# Patient Record
Sex: Female | Born: 1935 | Race: White | Hispanic: No
Health system: Southern US, Community
[De-identification: ages and names within clinical notes are randomized; demographics above are authoritative.]

---

## 1997-08-11 ENCOUNTER — Other Ambulatory Visit: Admission: RE | Admit: 1997-08-11 | Discharge: 1997-08-11 | Payer: Self-pay | Admitting: Internal Medicine

## 1997-08-12 ENCOUNTER — Other Ambulatory Visit: Admission: RE | Admit: 1997-08-12 | Discharge: 1997-08-12 | Payer: Self-pay | Admitting: Internal Medicine

## 1997-09-20 ENCOUNTER — Other Ambulatory Visit: Admission: RE | Admit: 1997-09-20 | Discharge: 1997-09-20 | Payer: Self-pay | Admitting: Internal Medicine

## 1998-08-02 ENCOUNTER — Other Ambulatory Visit: Admission: RE | Admit: 1998-08-02 | Discharge: 1998-08-02 | Payer: Self-pay | Admitting: Internal Medicine

## 1999-02-01 ENCOUNTER — Ambulatory Visit (HOSPITAL_COMMUNITY): Admission: RE | Admit: 1999-02-01 | Discharge: 1999-02-01 | Payer: Self-pay | Admitting: Specialist

## 1999-03-29 ENCOUNTER — Ambulatory Visit (HOSPITAL_COMMUNITY): Admission: RE | Admit: 1999-03-29 | Discharge: 1999-03-29 | Payer: Self-pay | Admitting: Specialist

## 1999-06-02 ENCOUNTER — Encounter: Admission: RE | Admit: 1999-06-02 | Discharge: 1999-06-02 | Payer: Self-pay | Admitting: Internal Medicine

## 1999-06-02 ENCOUNTER — Encounter: Payer: Self-pay | Admitting: Internal Medicine

## 1999-06-22 ENCOUNTER — Other Ambulatory Visit: Admission: RE | Admit: 1999-06-22 | Discharge: 1999-06-22 | Payer: Self-pay | Admitting: Internal Medicine

## 1999-06-23 ENCOUNTER — Ambulatory Visit (HOSPITAL_COMMUNITY): Admission: RE | Admit: 1999-06-23 | Discharge: 1999-06-23 | Payer: Self-pay | Admitting: Internal Medicine

## 1999-09-28 ENCOUNTER — Encounter: Admission: RE | Admit: 1999-09-28 | Discharge: 1999-09-28 | Payer: Self-pay | Admitting: Internal Medicine

## 1999-09-28 ENCOUNTER — Encounter: Payer: Self-pay | Admitting: Internal Medicine

## 1999-09-29 ENCOUNTER — Encounter: Payer: Self-pay | Admitting: Internal Medicine

## 1999-09-29 ENCOUNTER — Encounter: Admission: RE | Admit: 1999-09-29 | Discharge: 1999-09-29 | Payer: Self-pay | Admitting: Internal Medicine

## 1999-09-30 ENCOUNTER — Encounter: Admission: RE | Admit: 1999-09-30 | Discharge: 1999-09-30 | Payer: Self-pay | Admitting: Internal Medicine

## 1999-09-30 ENCOUNTER — Encounter: Payer: Self-pay | Admitting: Internal Medicine

## 2000-06-06 ENCOUNTER — Ambulatory Visit (HOSPITAL_COMMUNITY): Admission: RE | Admit: 2000-06-06 | Discharge: 2000-06-07 | Payer: Self-pay | Admitting: Internal Medicine

## 2000-06-07 ENCOUNTER — Encounter: Payer: Self-pay | Admitting: Internal Medicine

## 2001-12-15 ENCOUNTER — Encounter: Payer: Self-pay | Admitting: Emergency Medicine

## 2001-12-16 ENCOUNTER — Inpatient Hospital Stay (HOSPITAL_COMMUNITY): Admission: EM | Admit: 2001-12-16 | Discharge: 2001-12-17 | Payer: Self-pay | Admitting: Emergency Medicine

## 2001-12-17 ENCOUNTER — Encounter: Payer: Self-pay | Admitting: Internal Medicine

## 2002-02-03 ENCOUNTER — Ambulatory Visit (HOSPITAL_COMMUNITY): Admission: RE | Admit: 2002-02-03 | Discharge: 2002-02-03 | Payer: Self-pay | Admitting: Internal Medicine

## 2002-02-03 ENCOUNTER — Encounter: Payer: Self-pay | Admitting: Internal Medicine

## 2002-05-26 ENCOUNTER — Ambulatory Visit (HOSPITAL_COMMUNITY): Admission: RE | Admit: 2002-05-26 | Discharge: 2002-05-26 | Payer: Self-pay | Admitting: Internal Medicine

## 2002-06-24 ENCOUNTER — Encounter: Payer: Self-pay | Admitting: Internal Medicine

## 2002-06-24 ENCOUNTER — Ambulatory Visit (HOSPITAL_COMMUNITY): Admission: RE | Admit: 2002-06-24 | Discharge: 2002-06-24 | Payer: Self-pay | Admitting: Internal Medicine

## 2002-07-01 ENCOUNTER — Encounter (HOSPITAL_COMMUNITY): Admission: RE | Admit: 2002-07-01 | Discharge: 2002-07-31 | Payer: Self-pay | Admitting: Internal Medicine

## 2002-07-02 ENCOUNTER — Encounter: Payer: Self-pay | Admitting: Internal Medicine

## 2002-07-13 ENCOUNTER — Emergency Department (HOSPITAL_COMMUNITY): Admission: EM | Admit: 2002-07-13 | Discharge: 2002-07-13 | Payer: Self-pay | Admitting: Emergency Medicine

## 2002-07-13 ENCOUNTER — Encounter: Payer: Self-pay | Admitting: Emergency Medicine

## 2003-01-07 ENCOUNTER — Emergency Department (HOSPITAL_COMMUNITY): Admission: EM | Admit: 2003-01-07 | Discharge: 2003-01-07 | Payer: Self-pay | Admitting: Emergency Medicine

## 2003-03-15 ENCOUNTER — Ambulatory Visit (HOSPITAL_COMMUNITY): Admission: RE | Admit: 2003-03-15 | Discharge: 2003-03-15 | Payer: Self-pay | Admitting: Internal Medicine

## 2003-09-01 ENCOUNTER — Ambulatory Visit (HOSPITAL_COMMUNITY): Admission: RE | Admit: 2003-09-01 | Discharge: 2003-09-01 | Payer: Self-pay | Admitting: Cardiology

## 2004-04-12 ENCOUNTER — Ambulatory Visit: Payer: Self-pay | Admitting: Internal Medicine

## 2004-10-05 ENCOUNTER — Ambulatory Visit: Payer: Self-pay | Admitting: Internal Medicine

## 2005-04-03 ENCOUNTER — Ambulatory Visit: Payer: Self-pay | Admitting: Internal Medicine

## 2007-03-07 ENCOUNTER — Encounter: Payer: Self-pay | Admitting: Internal Medicine

## 2008-10-08 ENCOUNTER — Emergency Department (HOSPITAL_COMMUNITY): Admission: EM | Admit: 2008-10-08 | Discharge: 2008-10-08 | Payer: Self-pay | Admitting: Emergency Medicine

## 2009-08-04 ENCOUNTER — Other Ambulatory Visit: Admission: RE | Admit: 2009-08-04 | Discharge: 2009-08-04 | Payer: Self-pay | Admitting: Obstetrics and Gynecology

## 2009-08-12 ENCOUNTER — Ambulatory Visit (HOSPITAL_COMMUNITY): Admission: RE | Admit: 2009-08-12 | Discharge: 2009-08-12 | Payer: Self-pay | Admitting: Internal Medicine

## 2009-11-03 ENCOUNTER — Encounter: Payer: Self-pay | Admitting: Internal Medicine

## 2009-12-02 ENCOUNTER — Encounter: Admission: RE | Admit: 2009-12-02 | Discharge: 2009-12-02 | Payer: Self-pay | Admitting: Cardiology

## 2009-12-02 ENCOUNTER — Encounter: Payer: Self-pay | Admitting: Internal Medicine

## 2009-12-12 ENCOUNTER — Observation Stay (HOSPITAL_COMMUNITY): Admission: RE | Admit: 2009-12-12 | Discharge: 2009-12-13 | Payer: Self-pay | Admitting: Cardiovascular Disease

## 2010-01-27 ENCOUNTER — Encounter: Payer: Self-pay | Admitting: Internal Medicine

## 2010-03-21 ENCOUNTER — Ambulatory Visit
Admission: RE | Admit: 2010-03-21 | Discharge: 2010-03-21 | Payer: Self-pay | Source: Home / Self Care | Attending: Vascular Surgery | Admitting: Vascular Surgery

## 2010-03-22 NOTE — H&P (Signed)
HISTORY AND PHYSICAL EXAMINATION  March 21, 2010  Re:  Jaime [name], Jaime [name]                 DOB:  Sep 30, 1919  This patient presents today for evaluation of critical ischemia to her left foot.  She is here with multiple family members who care for her. She is a very pleasant, mildly demented, 77 year old white female with a long history of peripheral vascular occlusive disease.  She underwent recent evaluation at [facility] in October with arteriography by Dr. Nanetta Batty.  This reveals severe peripheral vascular occlusive disease with superficial femoral, popliteal and severe tibial disease with no tibial run off bilaterally.  She has had ulcerations over her foot and has had progressive pain and this is now severe and intolerable to her.  This is worse at night, and keeps her awake at night.  PAST HISTORY:  Notable for hypertension, elevated cholesterol.  She does have history of cardiac valvular disease.  PAST SURGICAL HISTORY:  Hemorrhoidectomy 15 years ago, knee replacement 14 years ago,  cataract surgery 8 years ago and D and C 30 years ago.  SOCIAL HISTORY:  She is widowed with two children.  She is retired.  She does not smoke or drink alcohol.  FAMILY HISTORY:  Significant for premature atherosclerotic disease in her father, otherwise negative.  REVIEW OF SYSTEMS:  GENERAL: No weight loss or gains, weight 120 pounds. She is 4 feet 11 inches tall. VASCULAR:  Positive for pain with nonhealing ulcer. CARDIAC:  For heart murmur. GI:  Positive for constipation. NEUROLOGIC: Negative. PULMONARY:  Positive for productive cough. GENITOURINARY:  Positive for urinary frequency. HEMATOLOGY:  Negative. ENT:  Negative. MUSCULOSKELETAL:  Positive for arthritis, joint pain.  PHYSICAL EXAMINATION:  A well-developed, well-nourished, frail white female appearing stated age in no acute distress.  Blood pressure is 127/72, pulse 95, respirations 18,  temperature is 97.6.  HEENT:  Normal. Chest:  Clear bilaterally.  Heart:  Regular rate and rhythm.  I do not appreciate a murmur.  Abdomen:  Soft, nontender.  No masses noted. Musculoskeletal:  Shows no major deformities.  Neurologic: No focal weakness or paresthesias.  Skin:  Without ulcers or rashes aside from her left foot where she has a superficial ulcerations over the toes, dorsum of the foot and heel.  I have very long discussion with multiple family members present and the patient.  I agree with Dr. Fredirick Maudlin assessment that there is no option for limb salvage.  I have recommended left above-knee amputation for control of her tissue loss and most importantly for pain control.  She understands this and wishes to proceed with surgery.  We have scheduled this for this coming Friday at [facility] on 03/09/2010 with admission day surgery.    Larina Earthly, M.D. Electronically Signed  TFE/MEDQ  D:  03/21/2010  T:  03/22/2010  Job:  [id number]  cc:   Thereasa Solo. Little, M.D. Kingsley Callander. Ouida Sills, MD

## 2010-03-23 LAB — PROTIME-INR
INR: 0.95 (ref 0.00–1.49)
Prothrombin Time: 12.9 seconds (ref 11.6–15.2)

## 2010-03-23 LAB — BASIC METABOLIC PANEL
BUN: 20 mg/dL (ref 6–23)
CO2: 23 mEq/L (ref 19–32)
Calcium: 9.5 mg/dL (ref 8.4–10.5)
Chloride: 94 mEq/L — ABNORMAL LOW (ref 96–112)
Creatinine, Ser: 0.91 mg/dL (ref 0.4–1.2)
GFR calc Af Amer: >60 mL/min (ref >60)
GFR calc non Af Amer: 58 mL/min — ABNORMAL LOW (ref >60)
Glucose, Bld: 125 mg/dL — ABNORMAL HIGH (ref 70–99)
Potassium: 4.4 mEq/L (ref 3.5–5.1)
Sodium: 127 mEq/L — ABNORMAL LOW (ref 135–145)

## 2010-03-23 LAB — CBC
HCT: 30.9 % — ABNORMAL LOW (ref 36.0–46.0)
Hemoglobin: 10.2 g/dL — ABNORMAL LOW (ref 12.0–15.0)
MCH: 30.4 pg (ref 26.0–34.0)
MCHC: 33 g/dL (ref 30.0–36.0)
MCV: 92.2 fL (ref 78.0–100.0)
Platelets: 437 10*3/uL — ABNORMAL HIGH (ref 150–400)
RBC: 3.35 MIL/uL — ABNORMAL LOW (ref 3.87–5.11)
RDW: 14.3 % (ref 11.5–15.5)
WBC: 7.4 10*3/uL (ref 4.0–10.5)

## 2010-03-23 LAB — APTT: aPTT: 31 seconds (ref 24–37)

## 2010-03-23 LAB — SURGICAL PCR SCREEN
MRSA, PCR: NEGATIVE
Staphylococcus aureus: NEGATIVE

## 2010-03-24 ENCOUNTER — Inpatient Hospital Stay (HOSPITAL_COMMUNITY)
Admission: RE | Admit: 2010-03-24 | Discharge: 2010-04-27 | DRG: 239 | Disposition: E | Payer: Medicare Other | Attending: Vascular Surgery | Admitting: Vascular Surgery

## 2010-03-24 DIAGNOSIS — E876 Hypokalemia: Secondary | ICD-10-CM | POA: Diagnosis not present

## 2010-03-24 DIAGNOSIS — I5021 Acute systolic (congestive) heart failure: Secondary | ICD-10-CM | POA: Diagnosis not present

## 2010-03-24 DIAGNOSIS — I739 Peripheral vascular disease, unspecified: Secondary | ICD-10-CM | POA: Diagnosis present

## 2010-03-24 DIAGNOSIS — J189 Pneumonia, unspecified organism: Secondary | ICD-10-CM | POA: Diagnosis not present

## 2010-03-24 DIAGNOSIS — J4489 Other specified chronic obstructive pulmonary disease: Secondary | ICD-10-CM | POA: Diagnosis present

## 2010-03-24 DIAGNOSIS — F068 Other specified mental disorders due to known physiological condition: Secondary | ICD-10-CM | POA: Diagnosis present

## 2010-03-24 DIAGNOSIS — J449 Chronic obstructive pulmonary disease, unspecified: Secondary | ICD-10-CM | POA: Diagnosis present

## 2010-03-24 DIAGNOSIS — I96 Gangrene, not elsewhere classified: Principal | ICD-10-CM | POA: Diagnosis present

## 2010-03-24 DIAGNOSIS — I509 Heart failure, unspecified: Secondary | ICD-10-CM | POA: Diagnosis not present

## 2010-03-24 LAB — ABO/RH: ABO/RH(D): B NEG

## 2010-03-25 LAB — BASIC METABOLIC PANEL
BUN: 18 mg/dL (ref 6–23)
CO2: 21 mEq/L (ref 19–32)
Calcium: 8.8 mg/dL (ref 8.4–10.5)
Chloride: 98 mEq/L (ref 96–112)
Creatinine, Ser: 1.11 mg/dL (ref 0.4–1.2)
GFR calc Af Amer: 56 mL/min — ABNORMAL LOW (ref >60)
GFR calc non Af Amer: 46 mL/min — ABNORMAL LOW (ref >60)
Glucose, Bld: 137 mg/dL — ABNORMAL HIGH (ref 70–99)
Potassium: 5 mEq/L (ref 3.5–5.1)
Sodium: 128 mEq/L — ABNORMAL LOW (ref 135–145)

## 2010-03-25 LAB — CBC
HCT: 27 % — ABNORMAL LOW (ref 36.0–46.0)
Hemoglobin: 8.7 g/dL — ABNORMAL LOW (ref 12.0–15.0)
MCH: 30.4 pg (ref 26.0–34.0)
MCHC: 32.2 g/dL (ref 30.0–36.0)
MCV: 94.4 fL (ref 78.0–100.0)
Platelets: 378 10*3/uL (ref 150–400)
RBC: 2.86 MIL/uL — ABNORMAL LOW (ref 3.87–5.11)
RDW: 14.6 % (ref 11.5–15.5)
WBC: 9 10*3/uL (ref 4.0–10.5)

## 2010-03-28 LAB — TYPE AND SCREEN
ABO/RH(D): B NEG
Antibody Screen: NEGATIVE
Unit division: 0
Unit division: 0

## 2010-03-28 LAB — BASIC METABOLIC PANEL
BUN: 28 mg/dL — ABNORMAL HIGH (ref 6–23)
CO2: 20 mEq/L (ref 19–32)
Calcium: 9.2 mg/dL (ref 8.4–10.5)
Chloride: 101 mEq/L (ref 96–112)
Creatinine, Ser: 1.14 mg/dL (ref 0.4–1.2)
GFR calc Af Amer: 54 mL/min — ABNORMAL LOW (ref >60)
GFR calc non Af Amer: 45 mL/min — ABNORMAL LOW (ref >60)
Glucose, Bld: 124 mg/dL — ABNORMAL HIGH (ref 70–99)
Potassium: 5 mEq/L (ref 3.5–5.1)
Sodium: 132 mEq/L — ABNORMAL LOW (ref 135–145)

## 2010-03-28 LAB — CBC
HCT: 29.1 % — ABNORMAL LOW (ref 36.0–46.0)
Hemoglobin: 9.4 g/dL — ABNORMAL LOW (ref 12.0–15.0)
MCH: 29.9 pg (ref 26.0–34.0)
MCHC: 32.3 g/dL (ref 30.0–36.0)
MCV: 92.7 fL (ref 78.0–100.0)
Platelets: 405 10*3/uL — ABNORMAL HIGH (ref 150–400)
RBC: 3.14 MIL/uL — ABNORMAL LOW (ref 3.87–5.11)
RDW: 14.7 % (ref 11.5–15.5)
WBC: 11.6 10*3/uL — ABNORMAL HIGH (ref 4.0–10.5)

## 2010-03-28 LAB — BRAIN NATRIURETIC PEPTIDE: Pro B Natriuretic peptide (BNP): >3200 pg/mL — ABNORMAL HIGH (ref 0.0–100.0)

## 2010-03-28 NOTE — Letter (Signed)
Summary: [facility]   Imported By: Sherian Rein 12/02/2009 14:20:01  _____________________________________________________________________  External Attachment:    Type:   Image     Comment:   External Document

## 2010-03-28 NOTE — Letter (Signed)
Summary: [facility]  [facility]   Imported By: Sherian Rein 12/12/2009 14:51:50  _____________________________________________________________________  External Attachment:    Type:   Image     Comment:   External Document

## 2010-03-29 ENCOUNTER — Inpatient Hospital Stay (HOSPITAL_COMMUNITY): Payer: Medicare Other

## 2010-03-29 LAB — HEMOGLOBIN AND HEMATOCRIT, BLOOD
HCT: 29.9 % — ABNORMAL LOW (ref 36.0–46.0)
Hemoglobin: 9.7 g/dL — ABNORMAL LOW (ref 12.0–15.0)

## 2010-03-29 LAB — MRSA PCR SCREENING: MRSA by PCR: NEGATIVE

## 2010-03-29 LAB — BRAIN NATRIURETIC PEPTIDE: Pro B Natriuretic peptide (BNP): >3200 pg/mL — ABNORMAL HIGH (ref 0.0–100.0)

## 2010-03-29 LAB — BASIC METABOLIC PANEL
BUN: 31 mg/dL — ABNORMAL HIGH (ref 6–23)
Calcium: 9.5 mg/dL (ref 8.4–10.5)
Chloride: 105 mEq/L (ref 96–112)
Creatinine, Ser: 1.14 mg/dL (ref 0.4–1.2)

## 2010-03-29 DEATH — deceased

## 2010-03-30 ENCOUNTER — Inpatient Hospital Stay (HOSPITAL_COMMUNITY): Payer: Medicare Other

## 2010-03-30 LAB — COMPREHENSIVE METABOLIC PANEL
BUN: 34 mg/dL — ABNORMAL HIGH (ref 6–23)
Calcium: 9.1 mg/dL (ref 8.4–10.5)
Glucose, Bld: 107 mg/dL — ABNORMAL HIGH (ref 70–99)
Sodium: 144 mEq/L (ref 135–145)
Total Protein: 5.9 g/dL — ABNORMAL LOW (ref 6.0–8.3)

## 2010-03-30 LAB — CBC
HCT: 30.9 % — ABNORMAL LOW (ref 36.0–46.0)
MCHC: 31.7 g/dL (ref 30.0–36.0)
RDW: 14.6 % (ref 11.5–15.5)

## 2010-03-30 LAB — BRAIN NATRIURETIC PEPTIDE: Pro B Natriuretic peptide (BNP): >3200 pg/mL — ABNORMAL HIGH (ref 0.0–100.0)

## 2010-03-30 NOTE — Letter (Signed)
Summary: [facility]  [facility]   Imported By: [facility] 02/13/2010 08:42:33  _____________________________________________________________________  External Attachment:    Type:   Image     Comment:   External Document

## 2010-03-31 ENCOUNTER — Inpatient Hospital Stay (HOSPITAL_COMMUNITY): Payer: Medicare Other

## 2010-03-31 LAB — URINALYSIS, ROUTINE W REFLEX MICROSCOPIC
Specific Gravity, Urine: 1.008 (ref 1.005–1.030)
Urine Glucose, Fasting: NEGATIVE mg/dL
pH: 7 (ref 5.0–8.0)

## 2010-03-31 LAB — BASIC METABOLIC PANEL
BUN: 39 mg/dL — ABNORMAL HIGH (ref 6–23)
Calcium: 9.4 mg/dL (ref 8.4–10.5)
Chloride: 103 mEq/L (ref 96–112)
GFR calc Af Amer: 57 mL/min — ABNORMAL LOW (ref >60)
GFR calc non Af Amer: 40 mL/min — ABNORMAL LOW (ref >60)
Glucose, Bld: 101 mg/dL — ABNORMAL HIGH (ref 70–99)
Potassium: 2.8 mEq/L — ABNORMAL LOW (ref 3.5–5.1)
Potassium: 3.4 mEq/L — ABNORMAL LOW (ref 3.5–5.1)

## 2010-03-31 LAB — URINE MICROSCOPIC-ADD ON

## 2010-04-01 ENCOUNTER — Inpatient Hospital Stay (HOSPITAL_COMMUNITY): Payer: Medicare Other

## 2010-04-02 LAB — CBC
MCHC: 30.2 g/dL (ref 30.0–36.0)
MCV: 95.7 fL (ref 78.0–100.0)
Platelets: 310 10*3/uL (ref 150–400)
RDW: 15.4 % (ref 11.5–15.5)
WBC: 24.6 10*3/uL — ABNORMAL HIGH (ref 4.0–10.5)

## 2010-04-02 LAB — BASIC METABOLIC PANEL
BUN: 72 mg/dL — ABNORMAL HIGH (ref 6–23)
Calcium: 9 mg/dL (ref 8.4–10.5)
Creatinine, Ser: 2.24 mg/dL — ABNORMAL HIGH (ref 0.4–1.2)
GFR calc non Af Amer: 21 mL/min — ABNORMAL LOW (ref >60)
Potassium: 2.9 mEq/L — ABNORMAL LOW (ref 3.5–5.1)

## 2010-04-05 NOTE — Op Note (Addendum)
  [name], [name]                 ACCOUNT NO.:  [id number]  MEDICAL RECORD NO.:  [id number]          PATIENT TYPE:  INP  LOCATION:  2025                         FACILITY:  [facility]  PHYSICIAN:  Larina Earthly, M.D.    DATE OF BIRTH:  12-20-1919  DATE OF PROCEDURE:  2010/04/13 DATE OF DISCHARGE:                              OPERATIVE REPORT   PREOPERATIVE DIAGNOSIS:  Ischemia and gangrene of left foot.  POSTOPERATIVE DIAGNOSIS:  Ischemia and gangrene of left foot.  PROCEDURE:  Left above-knee amputation.  SURGEON:  Larina Earthly, MD  ASSISTANT:  Nurse  ANESTHESIA:  General endotracheal.  COMPLICATIONS:  None.  DISPOSITION:  To recovery room stable.  ESTIMATED BLOOD LOSS:  Less than 100 mL.  PROCEDURE IN DETAIL:  The patient was taken to the operating room and placed in supine position.  The area of the left thigh and left leg were prepped and draped in the usual sterile fashion.  Using a fishmouth-type incision, the incision was made over the distal thigh and carried down to the subcutaneous tissue to the fascia.  The fascia was opened in line with the skin incision.  The muscle was all viable at this level.  The saphenous vein was ligated and divided.  The tributary, small edges of muscle were controlled with electrocautery.  The superficial femoral artery and superficial femoral vein were individually ligated with 0 Vicryl ties.  The periosteum was elevated off the femur, and the femur was divided with a Gigli saw.  The bone edges were smoothed with a bone rasp.  Wound was irrigated with saline and was closed with 0 Vicryl sutures in a figure- of-eight fashion to close the anterior to the posterior fascia.  The wound was again irrigated, and the skin was closed with skin clips. Sterile dressing was applied.  The patient was taken to the recovery room in stable condition.     Larina Earthly, M.D.     TFE/MEDQ  D:  04-13-10  T:  03/25/2010  Job:  [id number]  cc:    Thereasa Solo. Little, M.D.  Electronically Signed by Persephanie Laatsch M.D. on 04/05/2010 11:24:52 AM

## 2010-04-06 LAB — CULTURE, BLOOD (ROUTINE X 2)
Culture  Setup Time: 201202032336
Culture: NO GROWTH

## 2010-04-24 NOTE — Discharge Summary (Addendum)
Jaime [name], Jaime [name]                 ACCOUNT NO.:  [id number]  MEDICAL RECORD NO.:  [id number]           PATIENT TYPE:  I  LOCATION:  2610                         FACILITY:  [facility]  PHYSICIAN:  Larina Earthly, M.D.    DATE OF BIRTH:  Nov 03, 1919  DATE OF ADMISSION:  03/10/2010 DATE OF DISCHARGE:  2010-04-05                              DISCHARGE SUMMARY   DEATH SUMMARY.  HISTORY OF PRESENT ILLNESS:  The patient is an 77 year old woman with long history of peripheral vascular occlusive disease.  She underwent a recent evaluation at [facility], angiogram by Dr. Allyson Sabal which revealed severe peripheral occlusive disease, superficial femoral, popliteal, and severe tibial disease with no tibial runoff bilaterally. She has had ulcers over her foot and progressive pain which is now severe and intolerable.  She was admitted for left above-knee amputation.  PAST MEDICAL HISTORY:  Significant for hypertension, elevated cholesterol, cardiac valvular disease.  HOSPITAL COURSE:  The patient was taken to the operating room on March 24, 2010, for a left above-knee amputation.  Postoperatively, the patient did well.  She was alert.  She had decreasing pain over the next several days.  The wounds were healing well.  She is having some difficulty swallowing and had a swallow study done.  She remained comfortable.  She had some shortness of breath and was given some Lasix. She had __________ on March 27, 2010, and it was recommended she have a dysphagia I diet with nectar-thick liquids and crushing her pills. She was breathing better after the Lasix; however, on March 29, 2010, she received some breathing treatments and Lasix.  Chest x-ray showed no significant change.  She was on bypass and had agonal breathing.  She became progressively less responsive.  She was continued on supportive care, made do not resuscitate.  She was tachypneic, and 04-05-10, at 1:30 in the morning  telemetry showed asystole.  The patient had no pulses, and she was pronounced dead at that time.  FINAL DIAGNOSIS:  An 77 year old woman status post left above-knee amputation with postoperative respiratory distress and multiorgan failure who passed away on 04-05-10, with comfort measures.     Della Goo, PA-C   ______________________________ Larina Earthly, M.D.    RR/MEDQ  D:  04/21/2010  T:  04/22/2010  Job:  [id number]  Electronically Signed by Della Goo PA on 04/24/2010 01:07:28 PM Electronically Signed by Tawanna Cooler EARLY M.D. on 05/02/2010 09:59:39 AM Electronically Signed by Tawanna Cooler EARLY M.D. on 05/02/2010 10:09:16 AM Electronically Signed by Tawanna Cooler EARLY M.D. on 05/02/2010 10:18:10 AM Electronically Signed by Tawanna Cooler EARLY M.D. on 05/02/2010 10:27:20 AM Electronically Signed by Tawanna Cooler EARLY M.D. on 05/02/2010 10:36:17 AM Electronically Signed by Tawanna Cooler EARLY M.D. on 05/02/2010 10:45:26 AM Electronically Signed by Tawanna Cooler EARLY M.D. on 05/02/2010 10:55:30 AM Electronically Signed by Tawanna Cooler EARLY M.D. on 05/02/2010 11:05:33 AM Electronically Signed by Tawanna Cooler EARLY M.D. on 05/02/2010 11:15:51 AM Electronically Signed by Tawanna Cooler EARLY M.D. on 05/02/2010 11:28:00 AM Electronically Signed by Tawanna Cooler EARLY M.D. on 05/02/2010 11:41:18 AM Electronically Signed by Tawanna Cooler EARLY M.D. on 05/02/2010 11:54:47 AM Electronically Signed  by TODD EARLY M.D. on 05/02/2010 12:09:18 PM Electronically Signed by Tawanna Cooler EARLY M.D. on 05/02/2010 12:24:52 PM Electronically Signed by Tawanna Cooler EARLY M.D. on 05/02/2010 12:41:17 PM Electronically Signed by Tawanna Cooler EARLY M.D. on 05/02/2010 12:59:25 PM Electronically Signed by Tawanna Cooler EARLY M.D. on 05/02/2010 01:18:24 PM Electronically Signed by Tawanna Cooler EARLY M.D. on 05/02/2010 01:40:15 PM Electronically Signed by Tawanna Cooler EARLY M.D. on 05/02/2010 02:00:14 PM Electronically Signed by Tawanna Cooler EARLY M.D. on 05/02/2010 02:20:32 PM Electronically Signed by Tawanna Cooler EARLY M.D. on 05/02/2010  02:42:45 PM Electronically Signed by Tawanna Cooler EARLY M.D. on 05/02/2010 03:06:14 PM Electronically Signed by Tawanna Cooler EARLY M.D. on 05/02/2010 03:30:58 PM Electronically Signed by Tawanna Cooler EARLY M.D. on 05/02/2010 04:05:02 PM Electronically Signed by Tawanna Cooler EARLY M.D. on 05/02/2010 04:36:18 PM Electronically Signed by Tawanna Cooler EARLY M.D. on 05/02/2010 05:07:51 PM Electronically Signed by Tawanna Cooler EARLY M.D. on 05/02/2010 05:07:51 PM Electronically Signed by Tawanna Cooler EARLY M.D. on 05/02/2010 05:36:05 PM Electronically Signed by Tawanna Cooler EARLY M.D. on 05/02/2010 06:25:41 PM Electronically Signed by Tawanna Cooler EARLY M.D. on 05/02/2010 07:36:58 PM

## 2010-04-27 DEATH — deceased

## 2010-05-10 LAB — CBC
Hemoglobin: 10.9 g/dL — ABNORMAL LOW (ref 12.0–15.0)
MCH: 32 pg (ref 26.0–34.0)
MCHC: 32.8 g/dL (ref 30.0–36.0)
MCV: 97.4 fL (ref 78.0–100.0)
Platelets: 421 10*3/uL — ABNORMAL HIGH (ref 150–400)
RBC: 3.77 MIL/uL — ABNORMAL LOW (ref 3.87–5.11)
RDW: 13.6 % (ref 11.5–15.5)
WBC: 9.8 10*3/uL (ref 4.0–10.5)

## 2010-05-10 LAB — BASIC METABOLIC PANEL
BUN: 16 mg/dL (ref 6–23)
Calcium: 9.4 mg/dL (ref 8.4–10.5)
Calcium: 9.9 mg/dL (ref 8.4–10.5)
Chloride: 99 mEq/L (ref 96–112)
Creatinine, Ser: 0.79 mg/dL (ref 0.4–1.2)
Creatinine, Ser: 0.86 mg/dL (ref 0.4–1.2)
GFR calc Af Amer: >60 mL/min (ref >60)
GFR calc Af Amer: >60 mL/min (ref >60)
GFR calc non Af Amer: >60 mL/min (ref >60)

## 2010-05-10 LAB — PROTIME-INR: Prothrombin Time: 12.7 seconds (ref 11.6–15.2)

## 2010-05-10 LAB — APTT: aPTT: 29 seconds (ref 24–37)

## 2010-07-11 NOTE — Procedures (Signed)
NAMEMADGE, THERRIEN                 ACCOUNT NO.:  [id number]   MEDICAL RECORD NO.:  [id number]          PATIENT TYPE:  OIB   LOCATION:  2506                         FACILITY:  [facility]   PHYSICIAN:  Nanetta Batty, M.D.   DATE OF BIRTH:  Oct 18, 1919   DATE OF PROCEDURE:  DATE OF DISCHARGE:                    PERIPHERAL VASCULAR INVASIVE PROCEDURE    Ms. Schwanz is an 77 year old frail appearing Caucasian female with a  history of difficult to manage hypertension, chronic obstructive  pulmonary disease, history of dementia and severe PVOD with resting limb  ischemia.  She does not have any nonhealing wounds but does have lower  extremity Dopplers of the right ABI of 0.65, and the left of 0.29.  She  presents now for abdominal aortography, bifemoral runoff and potential  endovascular treatment for critical limb ischemia.   PROCEDURE DESCRIPTION:  The patient was brought to the Second Floor  [facility] Angiographic Suite in the post absorptive state.  She was  premedicated with p.o. Valium.  Her right groin was prepped and shaved  in usual sterile fashion.  Xylocaine 1% was used for local anesthesia.  A 5 upgraded to a 6-French sheath was inserted into the right femoral  artery using standard Seldinger technique.  A 5-French tennis racket  catheter was used for midstream and distal abdominal aortography with  bifemoral runoff using bolus-chase digital subtraction step-table  technique.  Visipaque dye was used for the entirety of the case.  Retrograde aortic pressures were monitored during the case.   ANGIOGRAPHIC RESULTS:  1. Abdominal aorta.      a.     Renal arteries - 90% right renal artery stenosis.      b.     Infrarenal abdominal aorta - moderate atherosclerotic       changes.  2. Left lower extremity;      a.     Diffuse 50-70% calcified left SFA stenosis in the proximal       mid third with 99% segmental stenosis in the distal third and       total occlusion in the  above-the-knee pop.      b.     A zero-vessel runoff below the knee.  3. Right lower extremity;  A,  Diffuse right SFA stenosis in the 50-70% range with a total  popliteal and zero vessel runoff below the knee.   IMPRESSION:  Ms. Cwik has severe infrainguinal and infrapopliteal  disease not amenable to percutaneous or surgical revascularization.  Continued medical therapy will be recommended.   The right femoral arterial puncture site was hemostatic and was sealed  with a Mynx closure device.  The patient left the lab in stable  condition.  She will be gently hydrated overnight.  Her labs rechecked.  She will be discharged home in the morning and will follow up with Dr.  Clarene Duke.  She left the lab in stable condition.      Nanetta Batty, M.D.      JB/MEDQ  D:  12/12/2009  T:  12/13/2009  Job:  [id number]   cc:   [address] [facility]  Angiographic  [facility]  Gaspar Garbe B. Little, M.D.  Kingsley Callander. Ouida Sills, MD  Rennis Chris. Maple Hudson, MD, [facility], FACP   Electronically Signed by Nanetta Batty M.D. on 12/29/2009 02:54:30 PM

## 2010-07-14 NOTE — Discharge Summary (Signed)
NAME:  Jaime [name], Jaime [name]                           ACCOUNT NO.:  [id number]   MEDICAL RECORD NO.:  [id number]                   PATIENT TYPE:  INP   LOCATION:  A318                                 FACILITY:  [facility]   PHYSICIAN:  Hanley Hays. Dechurch, M.D.           DATE OF BIRTH:  23-Aug-1919   DATE OF ADMISSION:  12/16/2001  DATE OF DISCHARGE:  12/17/2001                                 DISCHARGE SUMMARY   DIAGNOSES:  1. Probable gastroenteritis, resolved.  2. Mild pancreatitis, resolved.  Ultrasound abdomen unremarkable.  3. Gastroesophageal reflux disease.  4. Urinary tract infection, treated.  5. Chronic sinusitis with exacerbation, tolerating Allegra D.  6. Hypertension, stable.  7. Herpes zoster left eye on topical acyclovir per ophthalmology.   DISPOSITION:  The patient discharged to home.   CONDITION ON DISCHARGE:  Improved.   DISCHARGE MEDICATIONS:  1. Levaquin 250 daily to complete five day course.  2. Aciphex 20 mg daily.  3. Allegra D 60 mg q.12h. p.r.n.  4. Xanax 0.25 mg q.daily p.r.n.  5. Celebrex 100 mg daily.  6. Celexa 40 mg daily.  7. Flonase p.r.n.  8. Acyclovir 400 mg daily x1 year.  9. Meclizine 25 mg q.8h. p.r.n.  10.      Norvasc 5 mg daily.  11.      Toprol XL 50 mg daily.   ALLERGIES:  PENICILLIN.   HOSPITAL COURSE:  The patient is a 77 year old Caucasian female who  presented with a one day history of nausea/vomiting, inability to maintain  p.o.'s, and generally feeling poorly.  She had no change in mental status.  No diarrhea.  On presentation urinalysis was noted to be consistent with  UTI, although her white count was normal.  She had mild left shift.  BUN was  elevated 27 with creatinine 1.3.  Amylase was mildly elevated at 144 as was  lipase at 57.  Subsequently these returned to normal.  She was treated  empirically with Levaquin.  Ultrasound of her abdomen was obtained secondary  to a history of postprandial epigastric pain.  Liver and  pancreas were  thought to be normal.  Common bile duct was also normal.  There was no  gallbladder disease.  There was a question of mild collecting system  dilatation at the inferior pole of the left kidney.  The patient was  discharged to home in improved condition.   PHYSICAL EXAMINATION:  GENERAL:  Well-developed, well-nourished, elderly  female who is alert and appropriate, baseline mental status which is alert,  appropriate, and essentially normal.  NECK:  Supple.  LUNGS:  Clear to auscultation.  HEART:  Regular rate and rhythm.  ABDOMEN:  Soft, nontender with active bowel sounds.  EXTREMITIES:  Without clubbing, cyanosis, edema.  No skin rashes are noted.  VITAL SIGNS:  Blood pressure 153/77, pulse 85, temperature 98.2.   ASSESSMENT/PLAN:  As noted above.  Hanley Hays Josefine Class, M.D.    FED/MEDQ  D:  12/30/2001  T:  12/30/2001  Job:  [id number]

## 2010-07-14 NOTE — Procedures (Signed)
   NAME:  Jaime [name], Jaime [name]                           ACCOUNT NO.:  [id number]   MEDICAL RECORD NO.:  [id number]                   PATIENT TYPE:  OUT   LOCATION:  RAD                                  FACILITY:  [facility]   PHYSICIAN:  Vida Roller, M.D.                DATE OF BIRTH:  1919/06/11   DATE OF PROCEDURE:  05/26/2002  DATE OF DISCHARGE:                                  ECHOCARDIOGRAM   TAPE NUMBER:  [id number].   TAPE COUNT:  [id number].   HISTORY:  This is an 77 year old woman with fatigue and a murmur.   PROCEDURE:  The quality of the study is good.   M-MODE MEASUREMENTS:  AORTA:  The aorta is 33 mm.  LEFT ATRIUM:  The left atrium is 33 mm.  SEPTUM:  The septum is 18 mm, which is enlarged.  POSTERIOR WALL:  The posterior wall is 12, which is enlarged.  LEFT VENTRICULAR DIASTOLIC DIMENSION:  The left ventricular diastolic  dimension is 32 mm.  LEFT VENTRICULAR SYSTOLIC:  The left ventricular systolic dimension is 22  mm.  TWO-D AND DOPPLER IMAGING:  The left ventricle is normal size with  concentric left ventricular hypertrophy with predominance in the septum.  There is evidence of upper septal hypertrophy with a mild increase in the  left ventricular outflow velocity.  There is midcavity obliteration with  hyperdynamic left ventricular function.  The inferior and inferior-posterior  wall and some of the inferolateral wall are akinetic, consistent with an old  myocardial infarction.   The right ventricle is normal size with normal systolic function.   Both atria are mildly enlarged.  There is an atrial septal aneurysm with the  question of a patent foramen ovale.  The quality of the study precludes  assessment of left to right or right to left flow.   The aortic valve is sclerotic with evidence of a mild decrement in leaflet  movement.  There is a peak gradient of 11 mm across the valve, and there is  mild aortic insufficiency.   The mitral valve is significantly calcific  with a fixed posterior leaflet  and mitral annular calcification.  There is at least moderate mitral  regurgitation.   The tricuspid valve was not well-seen but appears to have at least mild  tricuspid insufficiency.   The pulmonary valve was not well-seen.  There were no subcostal views, so  assessment of the pericardial structures is limited, and the ascending aorta  is not well-seen.                                               Vida Roller, M.D.    JH/MEDQ  D:  05/26/2002  T:  05/27/2002  Job:  [id number]

## 2010-07-14 NOTE — H&P (Signed)
NAME:  Jaime [name], FONS NO.:  [id number]   MEDICAL RECORD NO.:  [id number]                   PATIENT TYPE:  EMS   LOCATION:  ED                                   FACILITY:  [facility]   PHYSICIAN:  Gracelyn Nurse, M.D.              DATE OF BIRTH:  12-28-1919   DATE OF ADMISSION:  12/15/2001  DATE OF DISCHARGE:                                HISTORY & PHYSICAL   CHIEF COMPLAINT:  Nausea and vomiting.   HISTORY OF PRESENT ILLNESS:  This is a 77 year old white female who presents  with nausea and vomiting that started suddenly this a.m.  She had some mild  epigastric pain just before she vomited.  She has vomited multiple times  today.  Has not been able to hold anything down.  She denies any hematemesis  or any dark tarry stools.   PAST MEDICAL HISTORY:  1. Hypertension.  2. Arthritis.  3. Depression.  4. Herpes zoster left eye.     A. Currently under treatment for one year.  5. Status post cataract surgery.  6. Status post hemorrhoidectomy.   ALLERGIES:  Penicillin.   CURRENT MEDICATIONS:  1. Xanax 0.25 mg q.d.  2. Acyclovir 400 mg q.d.  3. Celebrex 100 mg q.d.  4. Celexa 40 mg q.d.  5. Flonase p.r.n.  6. Meclizine 25 mg p.r.n.  7. Nitrofurantoin 100 mg q.d.  8. Norvasc 5 mg q.d.  9. Toprol XL 50 mg q.d.   SOCIAL HISTORY:  She does not smoke or drink any alcohol.  She is widowed  and has two children.   FAMILY HISTORY:  She has a sister with hypertension.  Her mother died, age  40, from a stroke.  Father had prostate cancer.   REVIEW OF SYSTEMS:  As per HPI.  All other systems reviewed and are normal.   PHYSICAL EXAMINATION:  VITAL SIGNS:  Temperature 98.3, pulse 99,  respirations 20, blood pressure 132/71.  GENERAL:  Well-nourished white female in no acute distress.  HEENT:  Pupils are equal, round, and reactive to light.  Extraocular  movements intact.  Oral mucosa moist.  Oropharynx is clear.  CARDIOVASCULAR:  Regular rate and  rhythm.  No murmurs.  LUNGS:  Clear to auscultation.  ABDOMEN:  Soft, nondistended.  Bowel sounds positive.  There is some mild  epigastric tenderness that does not localize.  EXTREMITIES:  No edema.  NEUROLOGIC:  Cranial nerves II-XII grossly intact.  SKIN:  Moist, with no rashes.   LABORATORY DATA:  Abdominal x-ray shows air fluid levels.  No sign of  obstruction.   White blood count 9, hemoglobin 13.6, platelets 405.  Sodium 135, potassium  4.8, chloride 96, CO2 33, BUN 27, creatinine 1.3.  Amylase 144, lipase 57.   ASSESSMENT AND PLAN:  1. Nausea and vomiting:  I feel this is likely secondary to a viral  gastroenteritis.  Will make her n.p.o., give IV fluids, will recheck her     lipase and amylase to be thorough, and guaiac her stools.  2. Hypertension:  Will continue her current medicines.  3. Depression:  Will continue those medicines also.  4. Herpes zoster of the left eye:  She is on acyclovir, and we will continue     that.                                               Gracelyn Nurse, M.D.    JDJ/MEDQ  D:  12/16/2001  T:  12/16/2001  Job:  [id number]

## 2010-07-14 NOTE — Op Note (Signed)
[place]  Patient:    Jaime [name], Jaime [name]                          MRN: [id number] Proc. Date: 03/29/99 Attending:  Chucky May, M.D.                           Operative Report  PREOPERATIVE DIAGNOSIS:  Cataract, left eye.  POSTOPERATIVE DIAGNOSIS:  Cataract, left eye.  OPERATION PERFORMED:  Cataract extraction with intraocular lens implant, left eye.  SURGEON:  Chucky May, M.D.  INDICATIONS FOR SURGERY:  The patient is a 77 year old female with painless progressive decrease in vision such that she has difficulty seeing for reading. On examination, she was found to have a dense nuclear sclerotic and cortical cataract consistent with decrease in visual acuity.  DESCRIPTION OF PROCEDURE:  The patient was brought to the main operating room and placed in supine position.  Anesthesia was obtained by means of topical 0.25% lidocaine drops with tetracaine.  She was then prepped and draped in the usual manner.  A lid speculum was inserted and the cornea was entered with a diamond keratome superiorly, with an additional port superotemporally.  Ocucoat was instilled and an anterior capsulorrhexis was performed without difficulty, followed by phacoemulsification of the nucleus and removal of residual cortical material by irrigation and aspiration.  The posterior capsule was polished and a posterior chamber lens implant was placed in the bag without difficulty.  Ocucoat was removed and replaced with balanced salt solution.  The wound was hydrated with balanced  salt solution and checked for fluid leaks but none were noted.  The eye was dressed with topical Pred Forte, Ocuflox, Voltaren and a Fox shield and the patient was  taken to recovery room in excellent condition, where she received written and verbal instructions for her postoperative care and was scheduled for followup in 24 hours. DD:  03/29/99 TD:  03/29/99 Job:  [id number]

## 2010-07-14 NOTE — Procedures (Signed)
   NAME:  Jaime [name], TATES NO.:  [id number]   MEDICAL RECORD NO.:  [id number]                  PATIENT TYPE:  PREC   LOCATION:                                       FACILITY:   PHYSICIAN:  Kingsley Callander. Ouida Sills, M.D.                  DATE OF BIRTH:   DATE OF PROCEDURE:  DATE OF DISCHARGE:                                    STRESS TEST   PROCEDURE:  Adenosine Cardiolite stress test.   FINDINGS/IMPRESSION:  This patient underwent an Adenosine Cardiolite stress  test according to the protocol.  The baseline electrocardiogram revealed  normal sinus rhythm at 76 beats/minute.   Adenosine was administered over 4 minutes.  Cardiolite was injected at 3  minutes.  The patient experienced mild flushing and pressure in the chest.  There were no EKG changes diagnostic of ischemia. She exhibited baseline  hypertension with a blood pressure of 178/88.  Her blood pressure improved  to 152/72 through recovery.  She reached a maximum heart rate response of  93.  Her symptoms resolved.  The Cardiolite images are pending.                                               Kingsley Callander. Ouida Sills, M.D.    ROF/MEDQ  D:  07/01/2002  T:  07/01/2002  Job:  [id number]

## 2011-10-21 IMAGING — CR DG HIP COMPLETE 2+V*R*
3 series · 3 of 3 positions shown · non-contrast
Comparison: None.

CLINICAL DATA: Hip pain.  No injury.

RIGHT HIP - COMPLETE 2+ VIEW

[view not recorded (1 of 3)]
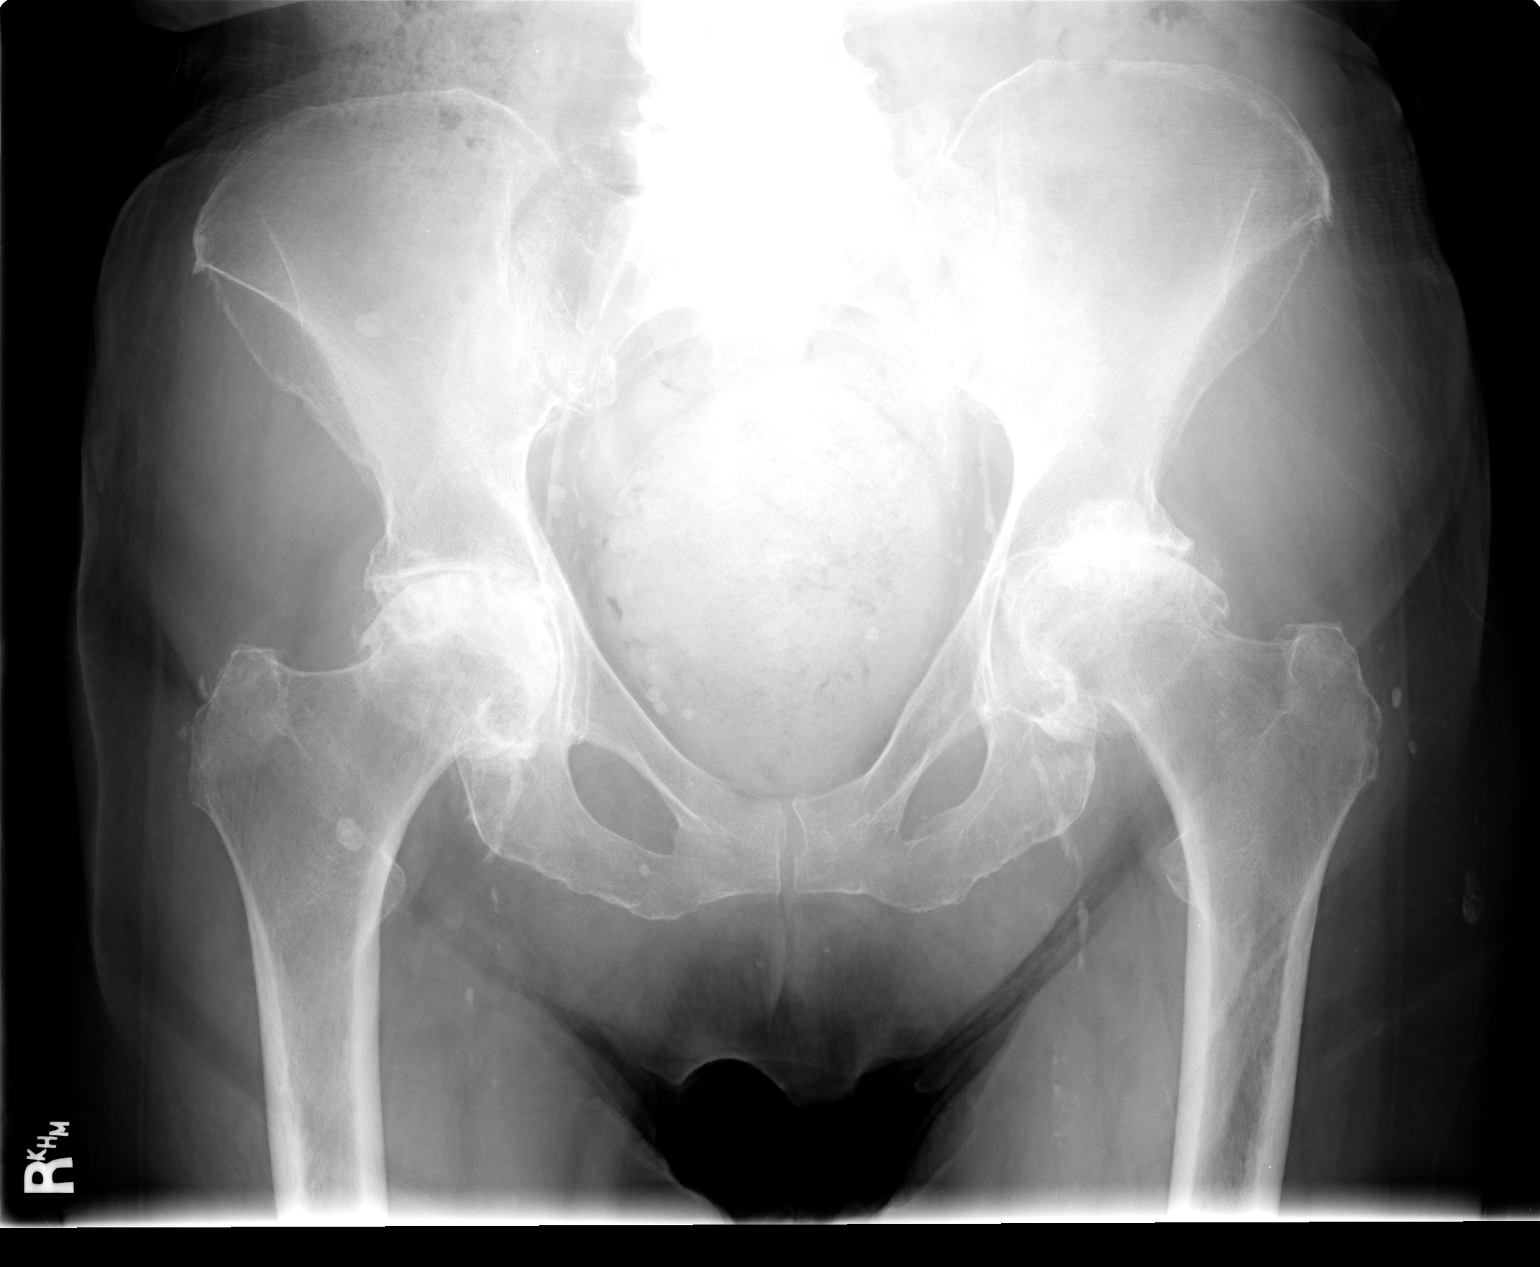

[view not recorded (2 of 3)]
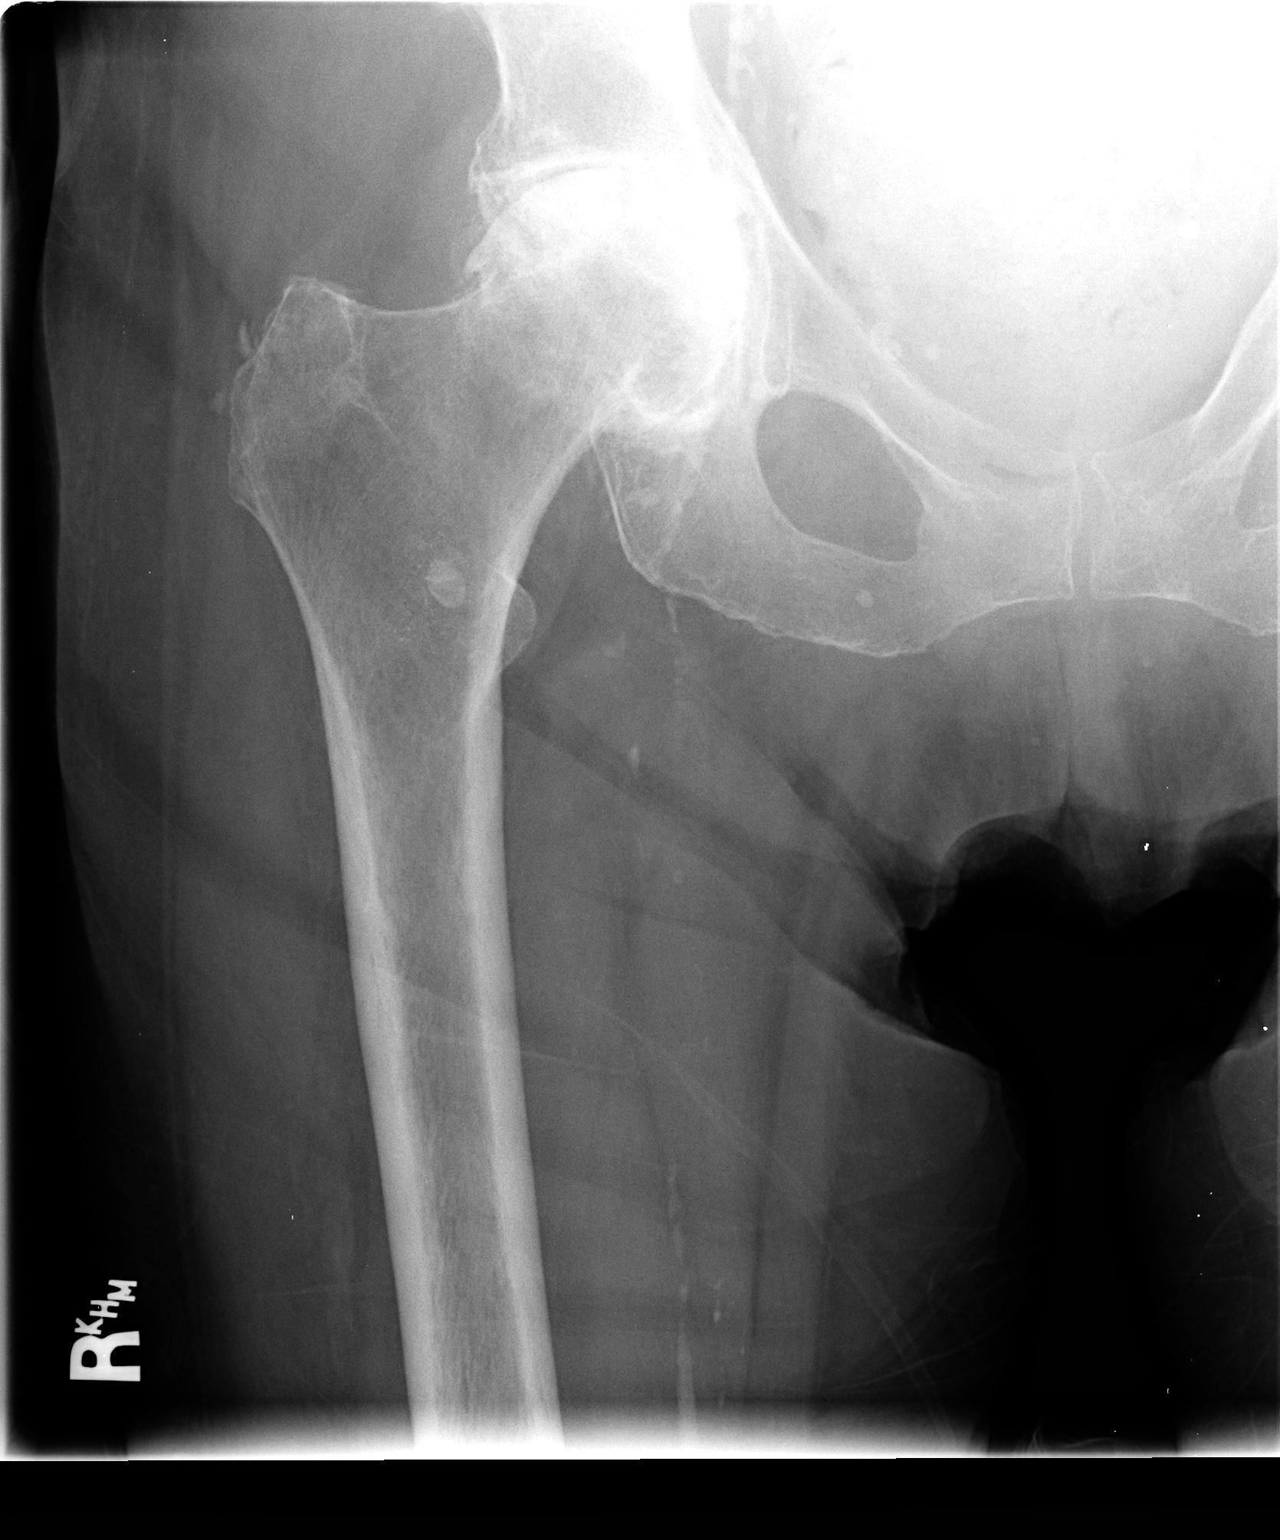

[view not recorded (3 of 3)]
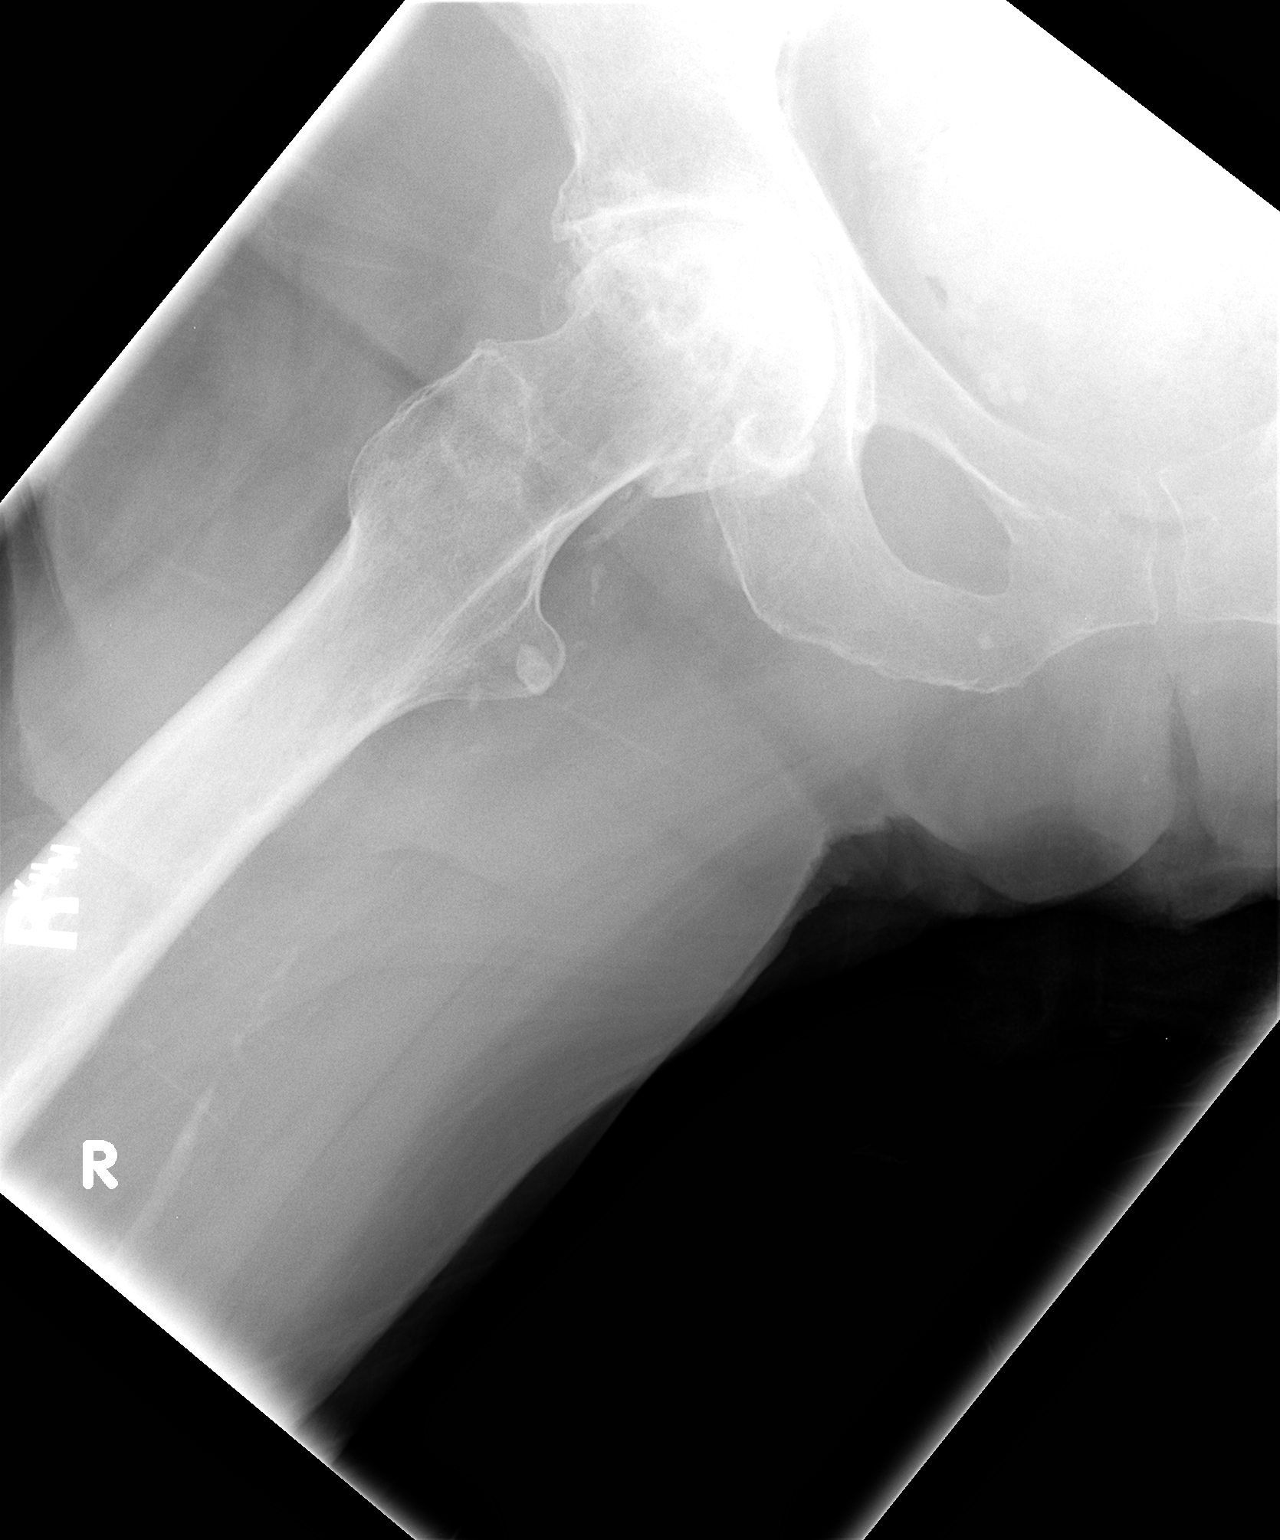

[3 of 3 positions shown; findings below may reference images not displayed]

FINDINGS: Marked bilateral hip joint degenerative changes with
joint space narrowing, subchondral sclerosis and cystic changes
with osteophyte formation.

Degenerative changes L4-5.

Vascular calcifications.

Moderate amount of stool in the rectosigmoid region.
IMPRESSION: Prominent bilateral hip joint degenerative changes.

This has been made a call report.

## 2012-02-10 IMAGING — CR DG CHEST 2V
2 series · 2 of 2 positions shown · non-contrast
Comparison: None

CLINICAL DATA: Shortness of breath.  Preop evaluation.  Venous
insufficiency in the lower extremities.

CHEST - 2 VIEW

[w chest pa]
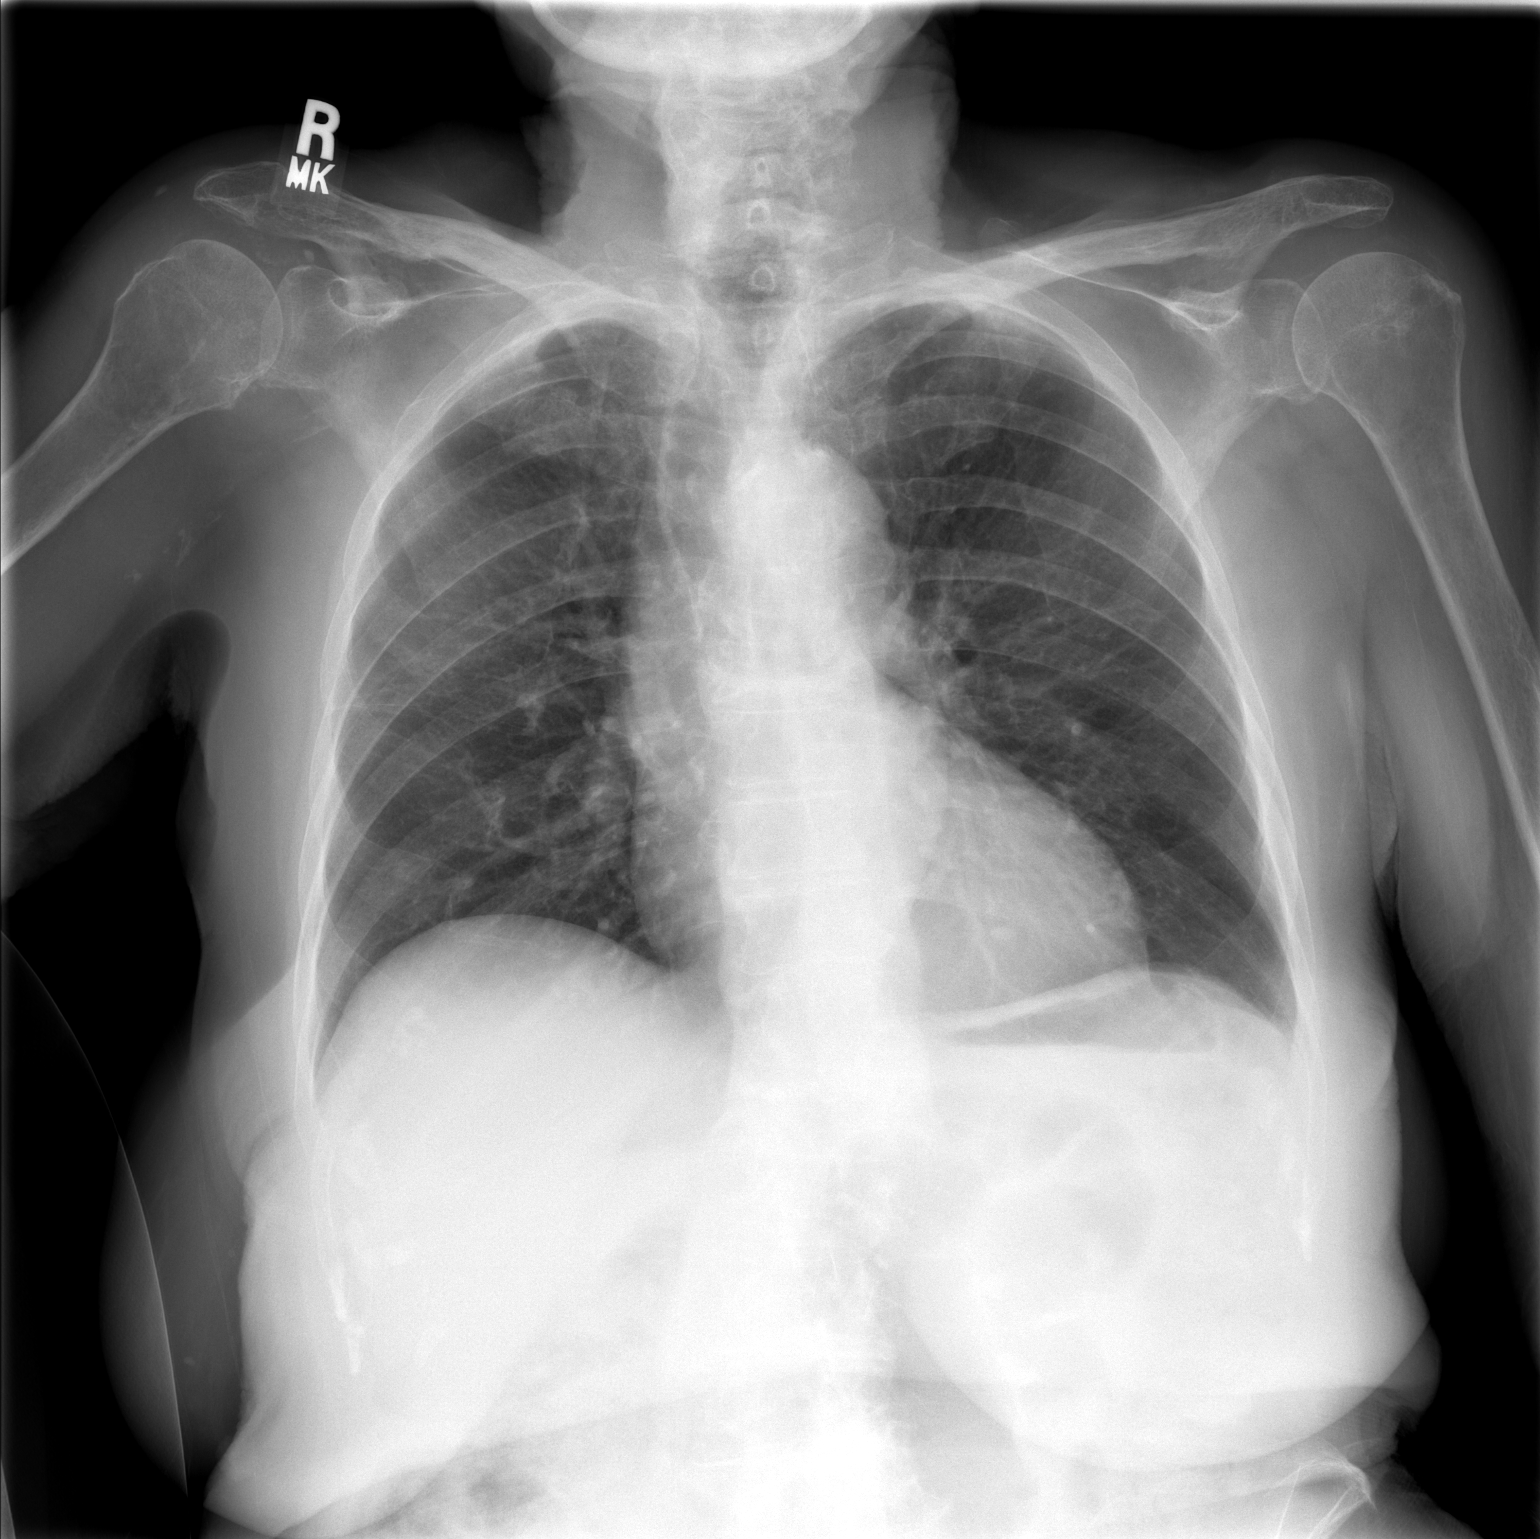

[w chest lat]
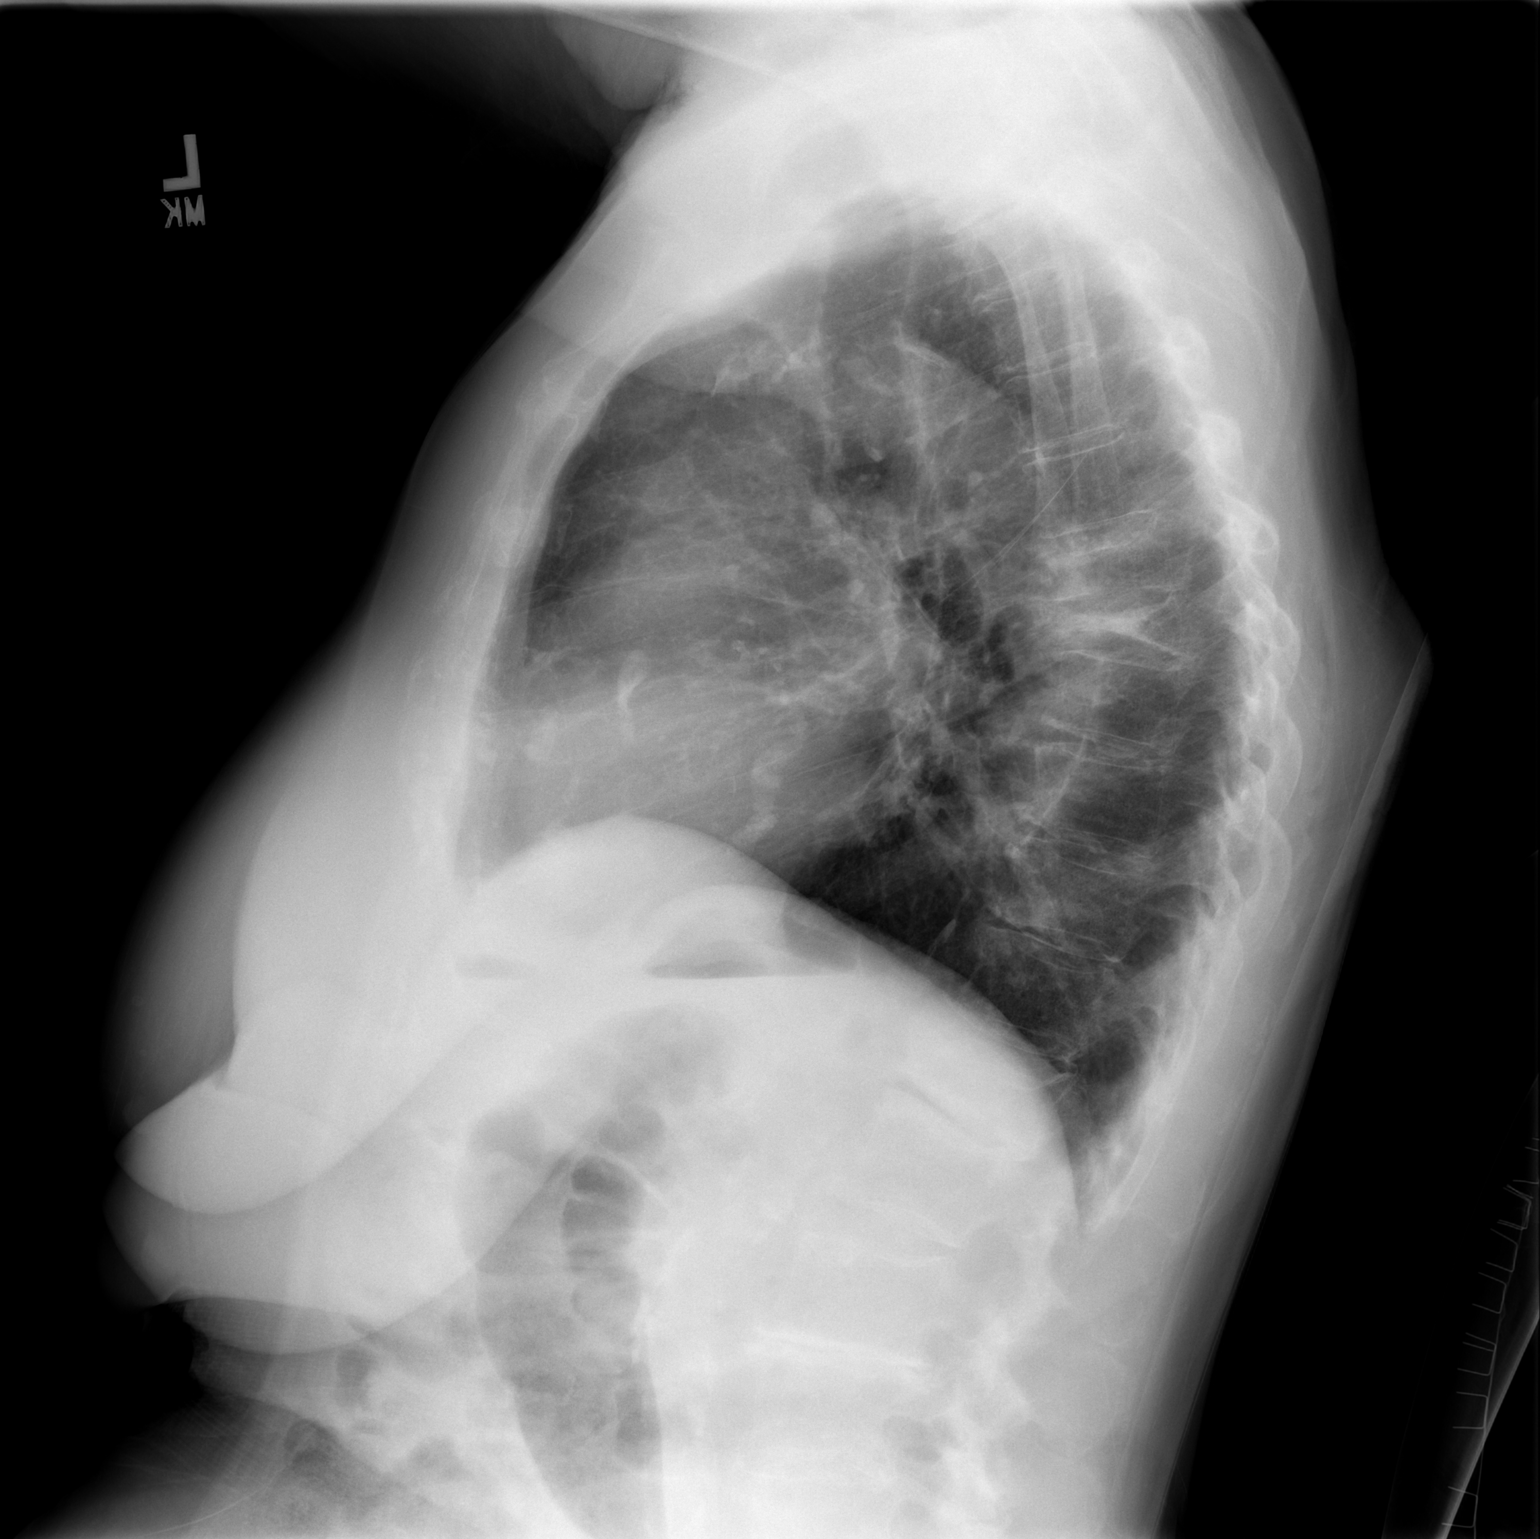

[2 of 2 positions shown; findings below may reference images not displayed]

FINDINGS: Heart is upper limits normal in size.  There is
tortuosity of the thoracic aorta.  No confluent opacities or
effusions in the lungs.

Severe compression fractures are noted in the mid thoracic spine.
Moderate compression fracture in the lower thoracic spine.
IMPRESSION: No active cardiopulmonary disease.

## 2014-06-07 ENCOUNTER — Encounter: Payer: Self-pay | Admitting: Internal Medicine

## 2014-06-07 NOTE — Progress Notes (Signed)
This encounter was created in error - please disregard.
